# Patient Record
Sex: Female | Born: 1970 | Race: White | Hispanic: No | Marital: Married | Smoking: Never smoker
Health system: Southern US, Community
[De-identification: ages and names within clinical notes are randomized; demographics above are authoritative.]

## PROBLEM LIST (undated history)

## (undated) DIAGNOSIS — R2231 Localized swelling, mass and lump, right upper limb: Secondary | ICD-10-CM

## (undated) DIAGNOSIS — K219 Gastro-esophageal reflux disease without esophagitis: Secondary | ICD-10-CM

## (undated) DIAGNOSIS — M1990 Unspecified osteoarthritis, unspecified site: Secondary | ICD-10-CM

## (undated) DIAGNOSIS — Z8632 Personal history of gestational diabetes: Secondary | ICD-10-CM

## (undated) DIAGNOSIS — R011 Cardiac murmur, unspecified: Secondary | ICD-10-CM

## (undated) DIAGNOSIS — J302 Other seasonal allergic rhinitis: Secondary | ICD-10-CM

## (undated) HISTORY — PX: TONSILLECTOMY: SUR1361

---

## 2009-11-29 ENCOUNTER — Encounter: Admission: RE | Admit: 2009-11-29 | Discharge: 2009-11-29 | Payer: Self-pay | Admitting: Otolaryngology

## 2010-02-16 ENCOUNTER — Ambulatory Visit (HOSPITAL_BASED_OUTPATIENT_CLINIC_OR_DEPARTMENT_OTHER): Admission: RE | Admit: 2010-02-16 | Discharge: 2010-02-16 | Payer: Self-pay | Admitting: Otolaryngology

## 2010-09-28 LAB — POCT HEMOGLOBIN-HEMACUE: Hemoglobin: 11.3 g/dL — ABNORMAL LOW (ref 12.0–15.0)

## 2011-05-18 IMAGING — CT CT PARANASAL SINUSES LIMITED
1 series · 10 of 12 positions shown, 13 images · non-contrast
Comparison: None.

CLINICAL DATA: Congestion, facial pain, headache

CT PARANASAL SINUS LIMITED WITHOUT CONTRAST
TECHNIQUE: Multidetector CT images of the paranasal sinuses were
obtained in a single plane without contrast.

[Series 3: coronal soft · axial · 0.33mm/px · z∈[-15,+75]mm · 10 of 12 slices shown, 13 images]
[im 2/12  brain]
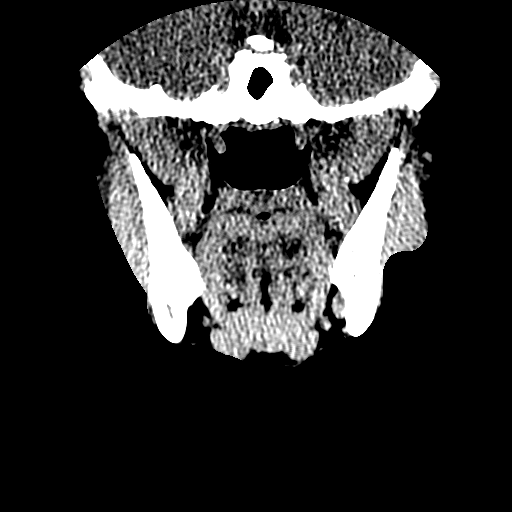
[im 2/12  bone]
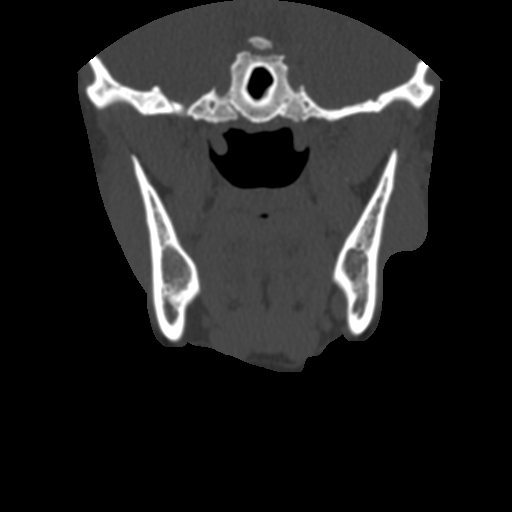
[im 3/12  bone]
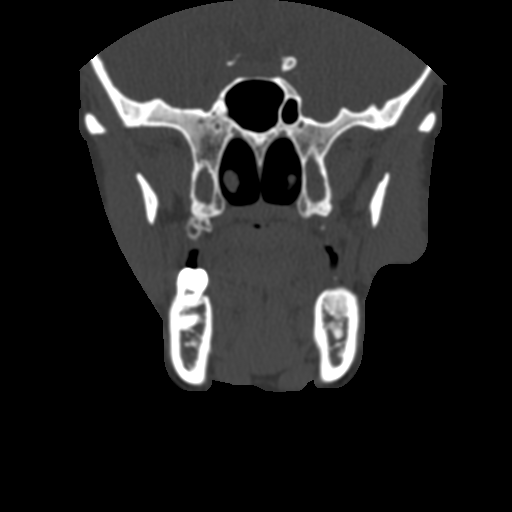
[im 4/12  bone]
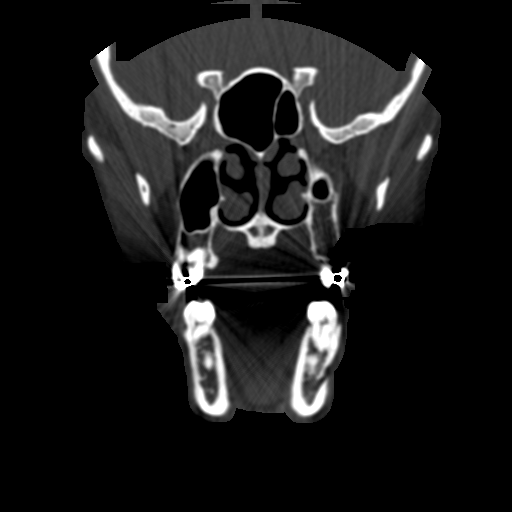
[im 5/12  bone]
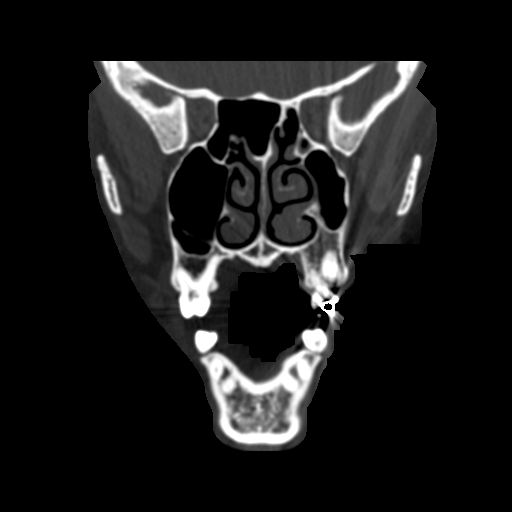
[im 6/12  brain]
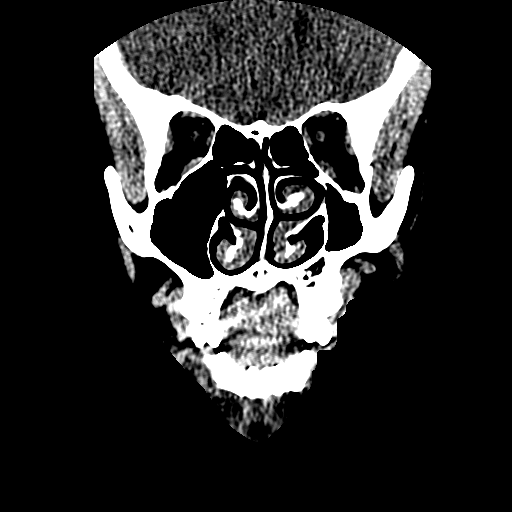
[im 6/12  bone]
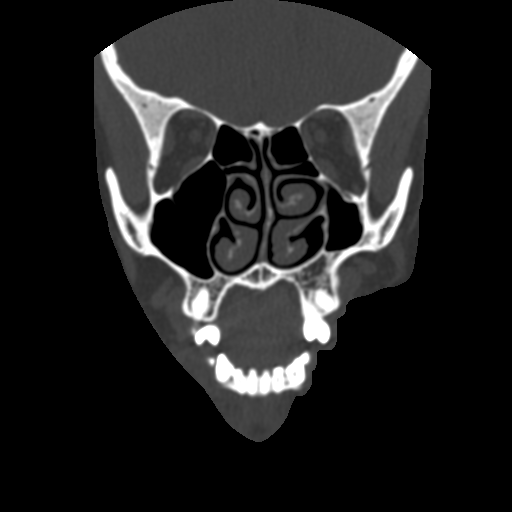
[im 7/12  bone]
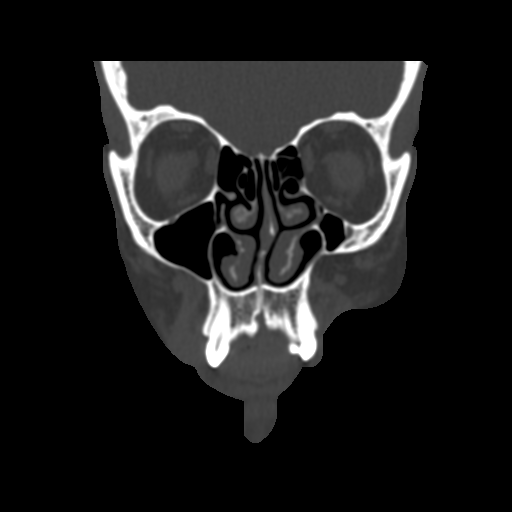
[im 8/12  bone]
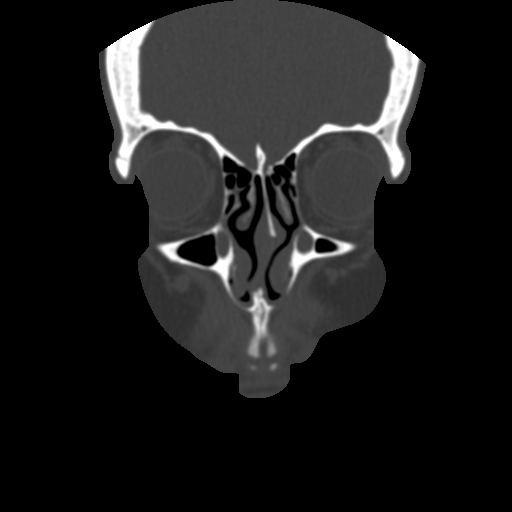
[im 9/12  bone]
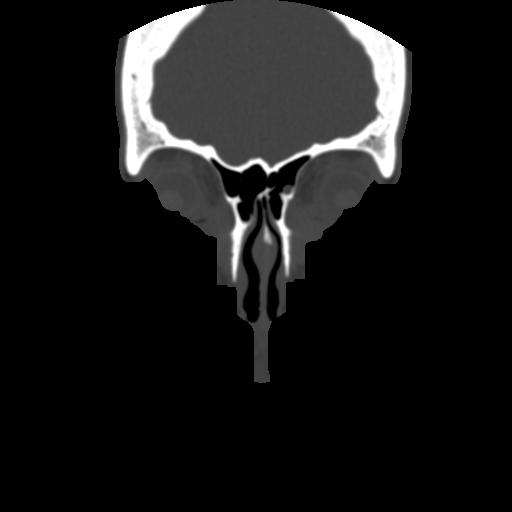
[im 10/12  brain]
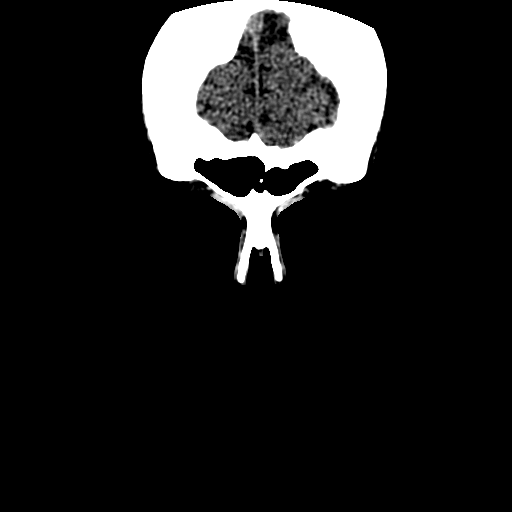
[im 10/12  bone]
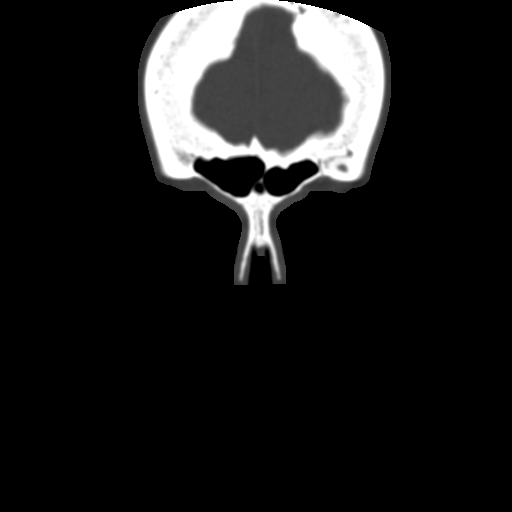
[im 11/12  bone]
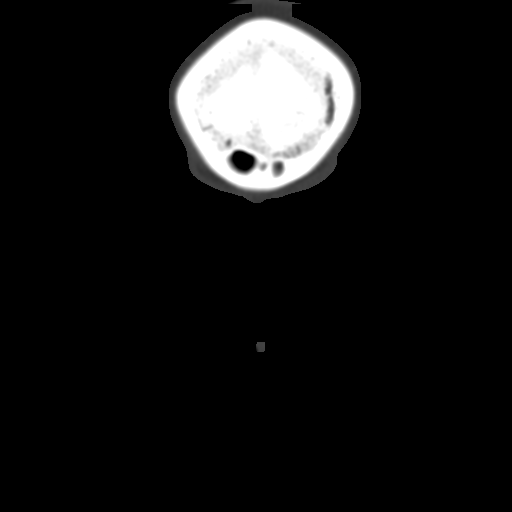

[10 of 12 positions shown; findings below may reference images not displayed]

FINDINGS: The paranasal sinuses are well pneumatized.  There is no
evidence of sinusitis.  A probable small polyp or retention cyst is
noted within the right frontal sinus.  The nasal turbinates are
somewhat prominent which does slightly compromise the nasal airway,
with nasal septal deviation noted to the right.  No acute bony
abnormality is noted.
IMPRESSION: 1.  No sinusitis.
2.  The nasal airway is somewhat compromised by prominent
turbinates and nasal septal deviation.

## 2017-10-24 ENCOUNTER — Other Ambulatory Visit: Payer: Self-pay | Admitting: Orthopedic Surgery

## 2017-11-05 ENCOUNTER — Other Ambulatory Visit: Payer: Self-pay

## 2017-11-05 ENCOUNTER — Encounter (HOSPITAL_BASED_OUTPATIENT_CLINIC_OR_DEPARTMENT_OTHER): Payer: Self-pay | Admitting: *Deleted

## 2017-11-05 NOTE — Progress Notes (Signed)
SPOKE W/ PT VIA PHONE FOR PRE-OP INTERVIEW.  NPO AFTER MN W/ EXCEPTION CLEAR LIQUIDS UNTIL 0730 (NO CREAM/ MILK PRODUCTS) .  ARRIVE AT 1130.  MAY TAKE TYLENOL SINUS/ ALLERGY IF NEEDED DOS W/ SIPS OF WATER.

## 2017-11-11 ENCOUNTER — Ambulatory Visit (HOSPITAL_BASED_OUTPATIENT_CLINIC_OR_DEPARTMENT_OTHER): Payer: BLUE CROSS/BLUE SHIELD | Admitting: Anesthesiology

## 2017-11-11 ENCOUNTER — Ambulatory Visit (HOSPITAL_BASED_OUTPATIENT_CLINIC_OR_DEPARTMENT_OTHER)
Admission: RE | Admit: 2017-11-11 | Discharge: 2017-11-11 | Disposition: A | Discharge status: Home / Self Care | Payer: BLUE CROSS/BLUE SHIELD | Source: Ambulatory Visit | Attending: Orthopedic Surgery | Admitting: Orthopedic Surgery

## 2017-11-11 ENCOUNTER — Encounter (HOSPITAL_BASED_OUTPATIENT_CLINIC_OR_DEPARTMENT_OTHER)
Admission: RE | Disposition: A | Discharge status: Home / Self Care | Payer: Self-pay | Source: Ambulatory Visit | Attending: Orthopedic Surgery

## 2017-11-11 ENCOUNTER — Other Ambulatory Visit: Payer: Self-pay

## 2017-11-11 ENCOUNTER — Encounter (HOSPITAL_BASED_OUTPATIENT_CLINIC_OR_DEPARTMENT_OTHER): Payer: Self-pay | Admitting: *Deleted

## 2017-11-11 DIAGNOSIS — R011 Cardiac murmur, unspecified: Secondary | ICD-10-CM | POA: Insufficient documentation

## 2017-11-11 DIAGNOSIS — R2231 Localized swelling, mass and lump, right upper limb: Secondary | ICD-10-CM | POA: Insufficient documentation

## 2017-11-11 DIAGNOSIS — M1990 Unspecified osteoarthritis, unspecified site: Secondary | ICD-10-CM | POA: Insufficient documentation

## 2017-11-11 DIAGNOSIS — M67441 Ganglion, right hand: Secondary | ICD-10-CM | POA: Diagnosis not present

## 2017-11-11 DIAGNOSIS — Z79899 Other long term (current) drug therapy: Secondary | ICD-10-CM | POA: Insufficient documentation

## 2017-11-11 DIAGNOSIS — K219 Gastro-esophageal reflux disease without esophagitis: Secondary | ICD-10-CM | POA: Diagnosis not present

## 2017-11-11 DIAGNOSIS — M79641 Pain in right hand: Secondary | ICD-10-CM | POA: Diagnosis not present

## 2017-11-11 HISTORY — DX: Cardiac murmur, unspecified: R01.1

## 2017-11-11 HISTORY — DX: Localized swelling, mass and lump, right upper limb: R22.31

## 2017-11-11 HISTORY — DX: Gastro-esophageal reflux disease without esophagitis: K21.9

## 2017-11-11 HISTORY — PX: EXCISION MASS UPPER EXTREMETIES: SHX6704

## 2017-11-11 HISTORY — DX: Personal history of gestational diabetes: Z86.32

## 2017-11-11 HISTORY — DX: Unspecified osteoarthritis, unspecified site: M19.90

## 2017-11-11 HISTORY — DX: Other seasonal allergic rhinitis: J30.2

## 2017-11-11 LAB — POCT PREGNANCY, URINE: Preg Test, Ur: NEGATIVE

## 2017-11-11 SURGERY — EXCISION MASS UPPER EXTREMITIES
Anesthesia: General | Site: Hand | Laterality: Right

## 2017-11-11 MED ORDER — PROPOFOL 10 MG/ML IV BOLUS
INTRAVENOUS | Status: AC
  Filled 2017-11-11: qty 20

## 2017-11-11 MED ORDER — MIDAZOLAM HCL 2 MG/2ML IJ SOLN
INTRAMUSCULAR | Status: AC
  Filled 2017-11-11: qty 2

## 2017-11-11 MED ORDER — DEXAMETHASONE SODIUM PHOSPHATE 10 MG/ML IJ SOLN
INTRAMUSCULAR | Status: AC
  Filled 2017-11-11: qty 1

## 2017-11-11 MED ORDER — BUPIVACAINE HCL (PF) 0.25 % IJ SOLN
INTRAMUSCULAR | Status: DC | PRN
  Administered 2017-11-11: 10 mL

## 2017-11-11 MED ORDER — OXYCODONE HCL 5 MG PO TABS
5.0000 mg | ORAL_TABLET | Freq: Once | ORAL | Status: DC | PRN
  Filled 2017-11-11: qty 1

## 2017-11-11 MED ORDER — ONDANSETRON HCL 4 MG/2ML IJ SOLN
INTRAMUSCULAR | Status: AC
  Filled 2017-11-11: qty 2

## 2017-11-11 MED ORDER — ONDANSETRON HCL 4 MG/2ML IJ SOLN
INTRAMUSCULAR | Status: DC | PRN
  Administered 2017-11-11: 4 mg via INTRAVENOUS

## 2017-11-11 MED ORDER — LIDOCAINE 2% (20 MG/ML) 5 ML SYRINGE
INTRAMUSCULAR | Status: AC
Start: 2017-11-11 — End: ?
  Filled 2017-11-11: qty 5

## 2017-11-11 MED ORDER — LIDOCAINE 2% (20 MG/ML) 5 ML SYRINGE
INTRAMUSCULAR | Status: DC | PRN
  Administered 2017-11-11: 60 mg via INTRAVENOUS

## 2017-11-11 MED ORDER — CEFAZOLIN SODIUM-DEXTROSE 2-4 GM/100ML-% IV SOLN
2.0000 g | INTRAVENOUS | Status: AC
  Administered 2017-11-11: 2 g via INTRAVENOUS
  Filled 2017-11-11: qty 100

## 2017-11-11 MED ORDER — LACTATED RINGERS IV SOLN
INTRAVENOUS | Status: DC
  Administered 2017-11-11: 13:00:00 via INTRAVENOUS
  Filled 2017-11-11: qty 1000

## 2017-11-11 MED ORDER — FENTANYL CITRATE (PF) 100 MCG/2ML IJ SOLN
INTRAMUSCULAR | Status: AC
  Filled 2017-11-11: qty 2

## 2017-11-11 MED ORDER — MIDAZOLAM HCL 2 MG/2ML IJ SOLN
INTRAMUSCULAR | Status: DC | PRN
  Administered 2017-11-11: 2 mg via INTRAVENOUS

## 2017-11-11 MED ORDER — HYDROMORPHONE HCL 1 MG/ML IJ SOLN
0.2500 mg | INTRAMUSCULAR | Status: DC | PRN
  Filled 2017-11-11: qty 0.5

## 2017-11-11 MED ORDER — DEXAMETHASONE SODIUM PHOSPHATE 10 MG/ML IJ SOLN
INTRAMUSCULAR | Status: DC | PRN
  Administered 2017-11-11: 10 mg via INTRAVENOUS

## 2017-11-11 MED ORDER — OXYCODONE HCL 5 MG/5ML PO SOLN
5.0000 mg | Freq: Once | ORAL | Status: DC | PRN
  Filled 2017-11-11: qty 5

## 2017-11-11 MED ORDER — PROPOFOL 10 MG/ML IV BOLUS
INTRAVENOUS | Status: DC | PRN
  Administered 2017-11-11: 180 mg via INTRAVENOUS

## 2017-11-11 MED ORDER — FENTANYL CITRATE (PF) 100 MCG/2ML IJ SOLN
INTRAMUSCULAR | Status: DC | PRN
  Administered 2017-11-11: 25 ug via INTRAVENOUS
  Administered 2017-11-11: 50 ug via INTRAVENOUS
  Administered 2017-11-11: 25 ug via INTRAVENOUS

## 2017-11-11 MED ORDER — PROMETHAZINE HCL 25 MG/ML IJ SOLN
6.2500 mg | INTRAMUSCULAR | Status: DC | PRN
  Filled 2017-11-11: qty 1

## 2017-11-11 MED ORDER — CEFAZOLIN SODIUM-DEXTROSE 2-4 GM/100ML-% IV SOLN
INTRAVENOUS | Status: AC
  Filled 2017-11-11: qty 100

## 2017-11-11 SURGICAL SUPPLY — 67 items
APL SKNCLS STERI-STRIP NONHPOA (GAUZE/BANDAGES/DRESSINGS)
BAG DECANTER FOR FLEXI CONT (MISCELLANEOUS) IMPLANT
BANDAGE ACE 3X5.8 VEL STRL LF (GAUZE/BANDAGES/DRESSINGS) ×3 IMPLANT
BANDAGE ACE 4X5 VEL STRL LF (GAUZE/BANDAGES/DRESSINGS) IMPLANT
BENZOIN TINCTURE PRP APPL 2/3 (GAUZE/BANDAGES/DRESSINGS) IMPLANT
BLADE SURG 15 STRL LF DISP TIS (BLADE) ×1 IMPLANT
BLADE SURG 15 STRL SS (BLADE) ×3
BNDG CMPR 9X4 STRL LF SNTH (GAUZE/BANDAGES/DRESSINGS)
BNDG COHESIVE 1X5 TAN STRL LF (GAUZE/BANDAGES/DRESSINGS) IMPLANT
BNDG ELASTIC 2X5.8 VLCR STR LF (GAUZE/BANDAGES/DRESSINGS) ×3 IMPLANT
BNDG ESMARK 4X9 LF (GAUZE/BANDAGES/DRESSINGS) IMPLANT
BNDG GAUZE ELAST 4 BULKY (GAUZE/BANDAGES/DRESSINGS) ×6 IMPLANT
CLOSURE WOUND 1/2 X4 (GAUZE/BANDAGES/DRESSINGS)
CORD BIPOLAR FORCEPS 12FT (ELECTRODE) ×3 IMPLANT
COVER BACK TABLE 60X90IN (DRAPES) ×3 IMPLANT
CUFF TOURNIQUET SINGLE 18IN (TOURNIQUET CUFF) IMPLANT
DECANTER SPIKE VIAL GLASS SM (MISCELLANEOUS) IMPLANT
DRAPE EXTREMITY T 121X128X90 (DRAPE) ×3 IMPLANT
DRAPE SURG 17X23 STRL (DRAPES) ×3 IMPLANT
DURAPREP 26ML APPLICATOR (WOUND CARE) ×3 IMPLANT
GAUZE PACKING IODOFORM 1/4X15 (GAUZE/BANDAGES/DRESSINGS) IMPLANT
GAUZE SPONGE 4X4 12PLY STRL (GAUZE/BANDAGES/DRESSINGS) ×3 IMPLANT
GAUZE SPONGE 4X4 16PLY XRAY LF (GAUZE/BANDAGES/DRESSINGS) ×3 IMPLANT
GAUZE XEROFORM 1X8 LF (GAUZE/BANDAGES/DRESSINGS) IMPLANT
GLOVE BIOGEL M STRL SZ7.5 (GLOVE) ×3 IMPLANT
GLOVE SURG SYN 8.0 (GLOVE) ×6 IMPLANT
GOWN STRL REIN XL XLG (GOWN DISPOSABLE) ×9 IMPLANT
GOWN STRL REUS W/ TWL LRG LVL3 (GOWN DISPOSABLE) ×1 IMPLANT
GOWN STRL REUS W/TWL LRG LVL3 (GOWN DISPOSABLE) ×3
HANDPIECE INTERPULSE COAX TIP (DISPOSABLE)
LOOP VESSEL MAXI BLUE (MISCELLANEOUS) IMPLANT
NEEDLE HYPO 25X1 1.5 SAFETY (NEEDLE) ×3 IMPLANT
NEEDLE PRECISIONGLIDE 27X1.5 (NEEDLE) IMPLANT
NS IRRIG 1000ML POUR BTL (IV SOLUTION) ×3 IMPLANT
PACK BASIN DAY SURGERY FS (CUSTOM PROCEDURE TRAY) ×3 IMPLANT
PAD CAST 3X4 CTTN HI CHSV (CAST SUPPLIES) ×1 IMPLANT
PADDING CAST ABS 3INX4YD NS (CAST SUPPLIES) ×2
PADDING CAST ABS 4INX4YD NS (CAST SUPPLIES) ×2
PADDING CAST ABS COTTON 3X4 (CAST SUPPLIES) ×1 IMPLANT
PADDING CAST ABS COTTON 4X4 ST (CAST SUPPLIES) ×1 IMPLANT
PADDING CAST COTTON 3X4 STRL (CAST SUPPLIES) ×3
SET HNDPC FAN SPRY TIP SCT (DISPOSABLE) IMPLANT
SHEET MEDIUM DRAPE 40X70 STRL (DRAPES) ×3 IMPLANT
SPLINT PLASTER CAST XFAST 3X15 (CAST SUPPLIES) IMPLANT
SPLINT PLASTER CAST XFAST 4X15 (CAST SUPPLIES) IMPLANT
SPLINT PLASTER XTRA FAST SET 4 (CAST SUPPLIES)
SPLINT PLASTER XTRA FASTSET 3X (CAST SUPPLIES)
STOCKINETTE 4X48 STRL (DRAPES) ×3 IMPLANT
STRIP CLOSURE SKIN 1/2X4 (GAUZE/BANDAGES/DRESSINGS) IMPLANT
SUT ETHILON 3 0 PS 1 (SUTURE) IMPLANT
SUT ETHILON 5 0 PS 2 18 (SUTURE) IMPLANT
SUT PROLENE 3 0 PS 2 (SUTURE) ×3 IMPLANT
SUT VIC AB 4-0 P-3 18XBRD (SUTURE) IMPLANT
SUT VIC AB 4-0 P3 18 (SUTURE)
SUT VICRYL RAPIDE 4-0 (SUTURE) IMPLANT
SUT VICRYL RAPIDE 4/0 PS 2 (SUTURE) IMPLANT
SWAB COLLECTION DEVICE MRSA (MISCELLANEOUS) ×3 IMPLANT
SWAB CULTURE ESWAB REG 1ML (MISCELLANEOUS) ×3 IMPLANT
SYR 10ML LL (SYRINGE) ×3 IMPLANT
SYR BULB 3OZ (MISCELLANEOUS) ×3 IMPLANT
SYR CONTROL 10ML LL (SYRINGE) ×3 IMPLANT
TAPE STRIPS DRAPE STRL (GAUZE/BANDAGES/DRESSINGS) ×3 IMPLANT
TOWEL OR 17X24 6PK STRL BLUE (TOWEL DISPOSABLE) ×3 IMPLANT
TUBE CONNECTING 12'X1/4 (SUCTIONS) ×1
TUBE CONNECTING 12X1/4 (SUCTIONS) ×2 IMPLANT
UNDERPAD 30X30 (UNDERPADS AND DIAPERS) ×3 IMPLANT
YANKAUER SUCT BULB TIP NO VENT (SUCTIONS) ×3 IMPLANT

## 2017-11-11 NOTE — Anesthesia Procedure Notes (Signed)
Procedure Name: LMA Insertion Date/Time: 11/11/2017 1:44 PM Performed by: Francie Massing, CRNA Pre-anesthesia Checklist: Patient identified, Emergency Drugs available, Suction available and Patient being monitored Patient Re-evaluated:Patient Re-evaluated prior to induction Oxygen Delivery Method: Circle system utilized Preoxygenation: Pre-oxygenation with 100% oxygen Induction Type: IV induction Ventilation: Mask ventilation without difficulty LMA: LMA inserted LMA Size: 4.0 Number of attempts: 1 Airway Equipment and Method: Bite block Placement Confirmation: positive ETCO2 Tube secured with: Tape Dental Injury: Teeth and Oropharynx as per pre-operative assessment

## 2017-11-11 NOTE — Discharge Instructions (Signed)

## 2017-11-11 NOTE — Transfer of Care (Signed)
Immediate Anesthesia Transfer of Care Note  Patient: Terry Williamson  Procedure(s) Performed: Procedure(s) (LRB): EXCISION MASS OF RIGHT HAND (Right)  Patient Location: PACU  Anesthesia Type: General  Level of Consciousness: awake, oriented, sedated and patient cooperative  Airway & Oxygen Therapy: Patient Spontanous Breathing and Patient connected to face mask oxygen  Post-op Assessment: Report given to PACU RN and Post -op Vital signs reviewed and stable  Post vital signs: Reviewed and stable  Complications: No apparent anesthesia complications Last Pain:  Vitals:   11/11/17 1128  TempSrc: Oral      Patients Stated Pain Goal: 7 (11/11/17 1147)

## 2017-11-11 NOTE — Anesthesia Postprocedure Evaluation (Signed)
Anesthesia Post Note  Patient: Cytogeneticist  Procedure(s) Performed: EXCISION MASS OF RIGHT HAND (Right Hand)     Patient location during evaluation: PACU Anesthesia Type: General Level of consciousness: awake and alert Pain management: pain level controlled Vital Signs Assessment: post-procedure vital signs reviewed and stable Respiratory status: spontaneous breathing, nonlabored ventilation, respiratory function stable and patient connected to nasal cannula oxygen Cardiovascular status: blood pressure returned to baseline and stable Postop Assessment: no apparent nausea or vomiting Anesthetic complications: no    Last Vitals:  Vitals:   11/11/17 1430 11/11/17 1518  BP: 134/87 128/75  Pulse: 63 70  Resp: 12 12  Temp:  36.9 C  SpO2: 100% 100%    Last Pain:  Vitals:   11/11/17 1518  TempSrc:   PainSc: 0-No pain                 Medrith Veillon DAVID

## 2017-11-11 NOTE — Op Note (Signed)
Please see dictated report [phone]

## 2017-11-11 NOTE — Anesthesia Preprocedure Evaluation (Addendum)
Anesthesia Evaluation  Patient identified by MRN, date of birth, ID band Patient awake    Reviewed: Allergy & Precautions, NPO status , Patient's Chart, lab work & pertinent test results  Airway Mallampati: II  TM Distance: >3 FB Neck ROM: Full    Dental no notable dental hx.    Pulmonary neg pulmonary ROS,    Pulmonary exam normal breath sounds clear to auscultation       Cardiovascular negative cardio ROS Normal cardiovascular exam Rhythm:Regular Rate:Normal     Neuro/Psych negative neurological ROS  negative psych ROS   GI/Hepatic Neg liver ROS,   Endo/Other  negative endocrine ROS  Renal/GU negative Renal ROS     Musculoskeletal negative musculoskeletal ROS (+)   Abdominal (+) + obese,   Peds  Hematology negative hematology ROS (+)   Anesthesia Other Findings RIGHT HAND MASS  Reproductive/Obstetrics hcg negative                            Anesthesia Physical Anesthesia Plan  ASA: II  Anesthesia Plan: General   Post-op Pain Management:    Induction: Intravenous  PONV Risk Score and Plan: 3 and Ondansetron, Dexamethasone, Midazolam and Treatment may vary due to age or medical condition  Airway Management Planned: LMA  Additional Equipment:   Intra-op Plan:   Post-operative Plan: Extubation in OR  Informed Consent: I have reviewed the patients History and Physical, chart, labs and discussed the procedure including the risks, benefits and alternatives for the proposed anesthesia with the patient or authorized representative who has indicated his/her understanding and acceptance.   Dental advisory given  Plan Discussed with: CRNA  Anesthesia Plan Comments:         Anesthesia Quick Evaluation

## 2017-11-11 NOTE — H&P (Signed)
Terry Williamson is an 47 y.o. female.   Chief Complaint: Painful right hand volar mass HPI: Patient is a very pleasant 47 year old right-hand-dominant female with a enlarging mass on the palmar aspect of her right hand  Past Medical History:  Diagnosis Date  . Arthritis    thumbs  . GERD (gastroesophageal reflux disease)    occasional , watches diet  . Heart murmur    per pt told by pcp mild murmur , asymptomatic , no echo done  . History of gestational diabetes   . Mass of right hand   . Seasonal allergies     Past Surgical History:  Procedure Laterality Date  . TONSILLECTOMY  02-16-2010   dr Ezzard Standing   [facility]    History reviewed. No pertinent family history. Social History:  reports that she has never smoked. She has never used smokeless tobacco. She reports that she drinks alcohol. She reports that she does not use drugs.  Allergies: No Known Allergies  Medications Prior to Admission  Medication Sig Dispense Refill  . Ascorbic Acid (VITAMIN C) 1000 MG tablet Take 1,000 mg by mouth daily.    Marland Kitchen BIOTIN PO Take 1 tablet by mouth daily.    . Calcium Carbonate (CALCIUM 600 PO) Take 2 tablets by mouth daily.    . Chlorphen-Pseudoephed-APAP (TYLENOL ALLERGY SINUS PO) Take by mouth as needed.    . cimetidine (TAGAMET) 200 MG tablet Take 200 mg by mouth as needed.    . Cyanocobalamin (B-12 PO) Take by mouth daily.    Marland Kitchen etonogestrel (NEXPLANON) 68 MG IMPL implant 1 each by Subdermal route once. Placed 11-18-2016    . fluconazole (DIFLUCAN) 200 MG tablet Take 200 mg by mouth once a week.    . folic acid (FOLVITE) 1 MG tablet Take 1 mg by mouth daily.    . Gluc-Chonn-MSM-Boswellia-Vit D (GLUCOSAMINE CHOND TRIPLE/VIT D PO) Take 2 tablets by mouth daily.    . Homeopathic Products (AZO YEAST PLUS) TABS Take 1 tablet by mouth daily.    Marland Kitchen ibuprofen (ADVIL,MOTRIN) 200 MG tablet Take 800 mg by mouth as directed. PER PT TOLD TO START TAKING  TID 2 DAYS PRIOR TO SURGERY ON 11-11-2017    .  Multiple Vitamin (MULTIVITAMIN) tablet Take 1 tablet by mouth daily.    Marland Kitchen PHENTERMINE HCL PO Take by mouth daily.      Results for orders placed or performed during the hospital encounter of 11/11/17 (from the past 48 hour(s))  Pregnancy, urine POC     Status: None   Collection Time: 11/11/17 12:12 PM  Result Value Ref Range   Preg Test, Ur NEGATIVE NEGATIVE    Comment:        THE SENSITIVITY OF THIS METHODOLOGY IS >24 mIU/mL    No results found.  Review of Systems  All other systems reviewed and are negative.   Blood pressure (!) 145/87, pulse 80, temperature 99.1 F (37.3 C), temperature source Oral, resp. rate 18, height  (1.6 m), weight 92.5 kg (204 lb), SpO2 100 %. Physical Exam  Constitutional: She is oriented to person, place, and time. She appears well-developed and well-nourished.  HENT:  Head: Normocephalic and atraumatic.  Neck: Normal range of motion.  Cardiovascular: Normal rate.  Respiratory: Effort normal.  Musculoskeletal:       Right hand: She exhibits tenderness and swelling.  Right hand volar mass with pain and swelling  Neurological: She is alert and oriented to person, place, and time.  Skin: Skin is  warm.  Psychiatric: She has a normal mood and affect. Her behavior is normal. Judgment and thought content normal.     Assessment/Plan 47 year old female with a large mass palmar aspect right hand.  We discussed in great detail the nature of her current predicament and treatment options.  Patient wished to proceed with excisional biopsy of this deep mass right hand as an outpatient.  All risks and benefits the procedure were expanded patient she wishes to proceed  Marlowe Shores, MD 11/11/2017, 1:28 PM

## 2017-11-12 ENCOUNTER — Encounter (HOSPITAL_BASED_OUTPATIENT_CLINIC_OR_DEPARTMENT_OTHER): Payer: Self-pay | Admitting: Orthopedic Surgery

## 2017-11-12 NOTE — Op Note (Signed)
NAMEAFREEN, SIEBELS             ACCOUNT NO.:  [id number]  MEDICAL RECORD NO.:  [id number]  LOCATION:                                 FACILITY:  PHYSICIAN:  Artist Pais. Mina Marble, M.D.   DATE OF BIRTH:  DATE OF PROCEDURE:  11/11/2017 DATE OF DISCHARGE:                              OPERATIVE REPORT   PREOPERATIVE DIAGNOSIS:  Painful enlarging mass, palmar aspect, right hand.  POSTOPERATIVE DIAGNOSIS:  Painful enlarging mass, palmar aspect, right hand.  PROCEDURE:  Excisional biopsy, deep mass, right hand.  SURGEON:  Artist Pais. Mina Marble, M.D.  ASSISTANT:  Jonni Sanger, P.A.  ANESTHESIA:  General.  COMPLICATION:  No complication.  DRAINS:  No drains.  SPECIMEN:  No specimen sent.  DESCRIPTION OF PROCEDURE:  The patient was taken to the operating suite and after induction of adequate general anesthetic, right upper extremity was prepped and draped in usual sterile fashion.  An Esmarch was used to exsanguinate the limb, tourniquet was then inflated to 250 mmHg.  At this point in time, an oblique incision was made of the A1 pulley area of the right index finger.  Skin was incised sharply. Dissection was carried down to the flexor sheath.  The ulnar and radial neurovascular bundles were identified.  The cystic lesion was seen coming off the flexor sheath in the area of the A1 pulley.  This was carefully excised with a small portion of the pulley.  The wound was thoroughly irrigated and loosely closed with a 3-0 Prolene subcuticular stitch.  Steri-Strips, 4 x 4, fluffs, and a compressive dressing were applied.  The patient tolerated this procedure well, went to the recovery room in stable fashion.     Artist Pais Mina Marble, M.D.     MAW/MEDQ  D:  11/11/2017  T:  11/11/2017  Job:  [id number]
